# Patient Record
Sex: Male | Born: 1975 | Race: White | Hispanic: No | Marital: Married | Smoking: Never smoker
Health system: Southern US, Community
[De-identification: ages and names within clinical notes are randomized; demographics above are authoritative.]

## PROBLEM LIST (undated history)

## (undated) DIAGNOSIS — K648 Other hemorrhoids: Secondary | ICD-10-CM

## (undated) DIAGNOSIS — I10 Essential (primary) hypertension: Secondary | ICD-10-CM

## (undated) DIAGNOSIS — K649 Unspecified hemorrhoids: Secondary | ICD-10-CM

## (undated) DIAGNOSIS — M1990 Unspecified osteoarthritis, unspecified site: Secondary | ICD-10-CM

## (undated) DIAGNOSIS — E669 Obesity, unspecified: Secondary | ICD-10-CM

## (undated) HISTORY — DX: Essential (primary) hypertension: I10

## (undated) HISTORY — DX: Other hemorrhoids: K64.8

## (undated) HISTORY — PX: WISDOM TOOTH EXTRACTION: SHX21

## (undated) HISTORY — DX: Unspecified hemorrhoids: K64.9

## (undated) HISTORY — DX: Obesity, unspecified: E66.9

---

## 2008-11-05 ENCOUNTER — Emergency Department (HOSPITAL_COMMUNITY): Admission: EM | Admit: 2008-11-05 | Discharge: 2008-11-05 | Payer: Self-pay | Admitting: Emergency Medicine

## 2008-11-10 ENCOUNTER — Emergency Department (HOSPITAL_COMMUNITY): Admission: EM | Admit: 2008-11-10 | Discharge: 2008-11-10 | Payer: Self-pay | Admitting: Emergency Medicine

## 2011-10-21 ENCOUNTER — Encounter (INDEPENDENT_AMBULATORY_CARE_PROVIDER_SITE_OTHER): Payer: Self-pay | Admitting: General Surgery

## 2011-10-22 ENCOUNTER — Encounter (INDEPENDENT_AMBULATORY_CARE_PROVIDER_SITE_OTHER): Payer: Self-pay | Admitting: General Surgery

## 2011-10-22 ENCOUNTER — Ambulatory Visit (INDEPENDENT_AMBULATORY_CARE_PROVIDER_SITE_OTHER): Payer: 59 | Admitting: General Surgery

## 2011-10-22 ENCOUNTER — Other Ambulatory Visit (INDEPENDENT_AMBULATORY_CARE_PROVIDER_SITE_OTHER): Payer: Self-pay | Admitting: General Surgery

## 2011-10-22 VITALS — BP 138/102 | HR 80 | Temp 97.4°F | Resp 16 | Ht 70.0 in | Wt 242.4 lb

## 2011-10-22 DIAGNOSIS — K648 Other hemorrhoids: Secondary | ICD-10-CM

## 2011-10-22 HISTORY — DX: Other hemorrhoids: K64.8

## 2011-10-22 NOTE — Patient Instructions (Signed)
Call our office at [phone] to schedule your surgery  Perform a enema the morning of surgery   Hemorrhoidectomy Hemorrhoidectomy is surgery to remove hemorrhoids. Hemorrhoids are veins that have become swollen in the rectum. The rectum is the area from the bottom end of the intestines to the opening where bowel movements leave the body. Hemorrhoids can be uncomfortable. They can cause itching, bleeding and pain if a blood clot forms in them (thrombose). If hemorrhoids are small, surgery may not be needed. But if they cover a larger area, surgery is usually suggested.  LET YOUR CAREGIVER KNOW ABOUT:   Any allergies.   All medications you are taking, including:   Herbs, eyedrops, over-the-counter medications and creams.   Blood thinners (anticoagulants), aspirin or other drugs that could affect blood clotting.   Use of steroids (by mouth or as creams).   Previous problems with anesthetics, including local anesthetics.   Possibility of pregnancy, if this applies.   Any history of blood clots.   Any history of bleeding or other blood problems.   Previous surgery.   Smoking history.   Other health problems.  RISKS AND COMPLICATIONS All surgery carries some risk. However, hemorrhoid surgery usually goes smoothly. Possible complications could include:  Urinary retention.   Bleeding.   Infection.   A painful incision.   A reaction to the anesthesia (this is not common).  BEFORE THE PROCEDURE   Stop using aspirin and non-steroidal anti-inflammatory drugs (NSAIDs) for pain relief. This includes prescription drugs and over-the-counter drugs such as ibuprofen and naproxen. Also stop taking vitamin E. If possible, do this two weeks before your surgery.   If you take blood-thinners, ask your healthcare provider when you should stop taking them.   You will probably have blood and urine tests done several days before your surgery.   Do not eat or drink for about 8 hours before the  surgery.   Arrive at least an hour before the surgery, or whenever your surgeon recommends. This will give you time to check in and fill out any needed paperwork.   Hemorrhoidectomy is often an outpatient procedure. This means you will be able to go home the same day. Sometimes, though, people stay overnight in the hospital after the procedure. Ask your surgeon what to expect. Either way, make arrangements in advance for someone to drive you home.  PROCEDURE   The preparation:   You will change into a hospital gown.   You will be given an IV. A needle will be inserted in your arm. Medication can flow directly into your body through this needle.   You might be given an enema to clear your rectum.   Once in the operating room, you will probably lie on your side or be repositioned later to lying on your stomach.   You will be given anesthesia (medication) so you will not feel anything during the surgery. The surgery often is done with local anesthesia (the area near the hemorrhoids will be numb and you will be drowsy but awake). Sometimes, general anesthesia is used (you will be asleep during the procedure).   The procedure:   There are a few different procedures for hemorrhoids. Be sure to ask you surgeon about the procedure, the risks and benefits.   Be sure to ask about what you need to do to take care of the wound, if there is one.  AFTER THE PROCEDURE  You will stay in a recovery area until the anesthesia has worn off.  Your blood pressure and pulse will be checked every so often.   You may feel a lot of pain in the area of the rectum.   Take all pain medication prescribed by your surgeon. Ask before taking any over-the-counter pain medicines.   Sometimes sitting in a warm bath can help relieve your pain.   To make sure you have bowel movements without straining:   You will probably need to take stool softeners (usually a pill) for a few days.   You should drink 8 to 10 glasses  of water each day.   Your activity will be restricted for awhile. Ask your caregiver for a list of what you should and should not do while you recover.  Document Released: 09/27/2009 Document Revised: 08/12/2011 Document Reviewed: 09/27/2009 [facility] Patient Information 2012 [place], [place].

## 2011-10-22 NOTE — Progress Notes (Signed)
Chief Complaint  Patient presents with  . New Evaluation    eval of bleeding internal hemorrhoids     HPI Stephen Carr is a 35 y.o. male.   HPI 76 year-old Caucasian male referred by his primary care physician for bleeding internal hemorrhoids. The patient states that his hemorrhoids have been bleeding for about past 5 weeks. He denies any pain with defecation. Sometimes there is some soreness. He denies any incontinence. He reports 2 bowel movements per day. He denies straining or reading in the bathroom. He eats  a high fiber diet. Sometimes the hemorrhoid will protrude after a bowel movement but it'll spontaneously reduce. He had a normal colonoscopy 3 years ago.  Past Medical History  Diagnosis Date  . Obesity   . Hemorrhoid   . Hypertension     Past Surgical History  Procedure Date  . Wisdom tooth extraction     History reviewed. No pertinent family history.  Social History History  Substance Use Topics  . Smoking status: Never Smoker   . Smokeless tobacco: Never Used  . Alcohol Use: 2.4 oz/week    2 Glasses of wine, 2 Cans of beer per week    No Known Allergies  Current Outpatient Prescriptions  Medication Sig Dispense Refill  . Folate-B12-Intrinsic Factor 800-500-20 MCG-MCG-MG TABS Take 400 mg by mouth daily.        Marland Kitchen HORSE CHESTNUT PO Take 470 mg by mouth daily.        . hydrocortisone (ANUSOL-HC) 2.5 % rectal cream Place 1 application rectally 2 (two) times daily.        Marland Kitchen ibuprofen (ADVIL,MOTRIN) 200 MG tablet Take 200 mg by mouth every 6 (six) hours as needed.        . Iron Combinations (IRON COMPLEX PO) Take 26 mg by mouth daily.        . Multiple Vitamin (MULTIVITAMIN) tablet Take 1 tablet by mouth daily.        Marland Kitchen RHODIOLA ROSEA PO Take 500 mg by mouth daily.        . vitamin B-12 (CYANOCOBALAMIN) 50 MCG tablet Take 30 mcg by mouth daily.        Marland Kitchen VITAMIN C, CALCIUM ASCORBATE, PO Take 15 mg by mouth daily.          Review of Systems Review of Systems    Constitutional: Negative for fever, chills and unexpected weight change.  HENT: Negative for hearing loss, congestion, sore throat, trouble swallowing and voice change.   Eyes: Negative for visual disturbance.  Respiratory: Negative for cough, chest tightness and wheezing.   Cardiovascular: Negative for chest pain, palpitations and leg swelling.  Gastrointestinal: Positive for blood in stool and anal bleeding. Negative for nausea, vomiting, abdominal pain, diarrhea, constipation, abdominal distention and rectal pain.  Genitourinary: Negative for urgency, hematuria and difficulty urinating.  Musculoskeletal: Negative for arthralgias.  Skin: Negative for rash and wound.  Neurological: Negative for seizures, syncope, weakness and headaches.  Hematological: Negative for adenopathy. Does not bruise/bleed easily.  Psychiatric/Behavioral: Negative for confusion.    Blood pressure 138/102, pulse 80, temperature 97.4 F (36.3 C), temperature source Temporal, resp. rate 16, height 5\' 10"  (1.778 m), weight 242 lb 6 oz (109.941 kg).  Physical Exam Physical Exam  Vitals reviewed. Constitutional: He is oriented to person, place, and time. He appears well-developed and well-nourished. No distress.  HENT:  Head: Normocephalic and atraumatic.  Eyes: Conjunctivae are normal. No scleral icterus.  Neck: Normal range of motion. Neck supple. No  JVD present. No thyromegaly present.  Cardiovascular: Normal rate and regular rhythm.   Pulmonary/Chest: Effort normal and breath sounds normal. No respiratory distress.  Abdominal: Soft. Bowel sounds are normal. He exhibits no distension. There is no tenderness.  Genitourinary: Rectal exam shows no external hemorrhoid, no fissure and anal tone normal.       Large left posterior lateral internal hem; sm right posterior internal hem  Musculoskeletal: Normal range of motion. He exhibits no edema.  Lymphadenopathy:    He has no cervical adenopathy.  Neurological: He  is alert and oriented to person, place, and time. He exhibits normal muscle tone.  Skin: Skin is warm and dry. No rash noted. No erythema.  Psychiatric: He has a normal mood and affect. His behavior is normal. Judgment and thought content normal.    Data Reviewed Dr Glendale Chard note  Assessment    Bleeding Two Column Internal Hemorrhoids    Plan    We discussed the etiology and management of internal hemorrhoids.  The patient has very good bowel habits (drinking 6-8 glasses water/day, eating a high fiber diet, etc).  I believe he has maximized medical management.   I have recommended an exam under anesthesia with excisional hemorrhoidectomy, possible banding. The patient has decided to proceed with surgery.   I discussed the procedure in detail.  The patient was given Agricultural engineer.  We discussed the risks and benefits of surgery including, but not limited to bleeding, infection, blood clot formation, anesthesia risk, urinary retention, hemorrhoid recurrence, injury to the sphincters resulting in incontinence, and the rare possibility of anal canal narrowing. I explained that the likelihood of improvement of their symptoms is good.  We discussed the typical postoperative course.  I stressed the importance of not becoming constipated after surgery.  The patient was encouraged to limit pain medication if possible as this increases the likelihood of becoming constipated. The patient was advised to take stool softners & drink 8-10 glasses of non-carbonated, non-alcoholic beverages per day and to eat a high fiber diet.  I also encouraged soaking in a water warm bath for 15 minutes at a time several times a day and after a bowel movement.  The patient was advised to take laxatives such as milk of magnesia or Miralax if no bowel movement three days after surgery.  The patient was advised to expect some blood tinged drainage as well as some blood in their bowel movements.   He is going to call  our office to schedule surgery after talking with his wife.  Mary Sella. Andrey Campanile, MD, FACS General, Bariatric, & Minimally Invasive Surgery [facility], [place]         [facility] M 10/22/2011, 2:09 PM

## 2011-11-23 ENCOUNTER — Encounter (HOSPITAL_COMMUNITY): Payer: Self-pay | Admitting: Pharmacy Technician

## 2011-11-25 ENCOUNTER — Ambulatory Visit (HOSPITAL_COMMUNITY)
Admission: RE | Admit: 2011-11-25 | Discharge: 2011-11-25 | Disposition: A | Discharge status: Home / Self Care | Payer: 59 | Source: Ambulatory Visit | Attending: General Surgery | Admitting: General Surgery

## 2011-11-25 ENCOUNTER — Encounter (HOSPITAL_COMMUNITY): Payer: Self-pay

## 2011-11-25 ENCOUNTER — Encounter (HOSPITAL_COMMUNITY)
Admission: RE | Admit: 2011-11-25 | Discharge: 2011-11-25 | Disposition: A | Discharge status: Home / Self Care | Payer: 59 | Source: Ambulatory Visit | Attending: General Surgery | Admitting: General Surgery

## 2011-11-25 ENCOUNTER — Other Ambulatory Visit: Payer: Self-pay

## 2011-11-25 DIAGNOSIS — I10 Essential (primary) hypertension: Secondary | ICD-10-CM | POA: Insufficient documentation

## 2011-11-25 DIAGNOSIS — Z01818 Encounter for other preprocedural examination: Secondary | ICD-10-CM | POA: Insufficient documentation

## 2011-11-25 DIAGNOSIS — Z01810 Encounter for preprocedural cardiovascular examination: Secondary | ICD-10-CM | POA: Insufficient documentation

## 2011-11-25 DIAGNOSIS — Z01812 Encounter for preprocedural laboratory examination: Secondary | ICD-10-CM | POA: Insufficient documentation

## 2011-11-25 HISTORY — DX: Unspecified osteoarthritis, unspecified site: M19.90

## 2011-11-25 LAB — SURGICAL PCR SCREEN: Staphylococcus aureus: NEGATIVE

## 2011-11-25 LAB — CBC
MCHC: 34.8 g/dL (ref 30.0–36.0)
Platelets: 257 10*3/uL (ref 150–400)
RBC: 5.16 MIL/uL (ref 4.22–5.81)

## 2011-11-25 LAB — BASIC METABOLIC PANEL
BUN: 10 mg/dL (ref 6–23)
Calcium: 9.7 mg/dL (ref 8.4–10.5)
GFR calc non Af Amer: >90 mL/min (ref >90)
Potassium: 4.1 mEq/L (ref 3.5–5.1)
Sodium: 140 mEq/L (ref 135–145)

## 2011-11-25 LAB — DIFFERENTIAL
Basophils Absolute: 0 10*3/uL (ref 0.0–0.1)
Basophils Relative: 0 % (ref 0–1)
Lymphocytes Relative: 39 % (ref 12–46)
Neutro Abs: 3.2 10*3/uL (ref 1.7–7.7)
Neutrophils Relative %: 51 % (ref 43–77)

## 2011-11-25 NOTE — Pre-Procedure Instructions (Signed)
PT IS JEHOVAH'S WITNESS--SIGNED REFUSAL FOR BLOOD.  COPY OF REFUSAL FAXED TO DR. Tawana Scale OFFICE AND COPY TAKEN TO BLOOD BANK.

## 2011-11-25 NOTE — Patient Instructions (Signed)
20 Stephen Carr  11/25/2011   Your procedure is scheduled on:  Monday 11/30/11  AT 7:15 AM  Report to [facility] at 5:15 AM.  Call this number if you have problems the morning of surgery: [phone]   Remember:FLEET ENEMA AM BEFORE YOUR SURGERY   Do not eat food OR DRINK ANYTHING AFTER MIDNIGHT THE NIGHT BEFORE YOUR SURGERY.       Do not wear jewelry, make-up or nail polish.  Do not wear lotions, powders, or perfumes. You may wear deodorant.  Do not shave 48 hours prior to surgery.  Do not bring valuables to the hospital.  Contacts, dentures or bridgework may not be worn into surgery.  Leave suitcase in the car. After surgery it may be brought to your room.  For patients admitted to the hospital, checkout time is 11:00 AM the day of discharge.   Patients discharged the day of surgery will not be allowed to drive home.  Name and phone number of your driver: WIFE  Special Instructions: CHG Shower Use Special Wash: 1/2 bottle night before surgery and 1/2 bottle morning of surgery.   Please read over the following fact sheets that you were given: MRSA Information

## 2011-11-30 ENCOUNTER — Encounter (HOSPITAL_COMMUNITY): Payer: Self-pay | Admitting: Anesthesiology

## 2011-11-30 ENCOUNTER — Other Ambulatory Visit (INDEPENDENT_AMBULATORY_CARE_PROVIDER_SITE_OTHER): Payer: Self-pay | Admitting: General Surgery

## 2011-11-30 ENCOUNTER — Ambulatory Visit (HOSPITAL_COMMUNITY)
Admission: RE | Admit: 2011-11-30 | Discharge: 2011-11-30 | Disposition: A | Discharge status: Home / Self Care | Payer: 59 | Source: Ambulatory Visit | Attending: General Surgery | Admitting: General Surgery

## 2011-11-30 ENCOUNTER — Encounter (HOSPITAL_COMMUNITY)
Admission: RE | Disposition: A | Discharge status: Home / Self Care | Payer: Self-pay | Source: Ambulatory Visit | Attending: General Surgery

## 2011-11-30 ENCOUNTER — Ambulatory Visit (HOSPITAL_COMMUNITY): Payer: 59 | Admitting: Anesthesiology

## 2011-11-30 ENCOUNTER — Encounter (HOSPITAL_COMMUNITY): Payer: Self-pay | Admitting: *Deleted

## 2011-11-30 DIAGNOSIS — K648 Other hemorrhoids: Secondary | ICD-10-CM | POA: Insufficient documentation

## 2011-11-30 DIAGNOSIS — Z79899 Other long term (current) drug therapy: Secondary | ICD-10-CM | POA: Insufficient documentation

## 2011-11-30 DIAGNOSIS — I10 Essential (primary) hypertension: Secondary | ICD-10-CM | POA: Insufficient documentation

## 2011-11-30 DIAGNOSIS — E669 Obesity, unspecified: Secondary | ICD-10-CM | POA: Insufficient documentation

## 2011-11-30 HISTORY — PX: HEMORRHOID SURGERY: SHX153

## 2011-11-30 SURGERY — EXAM UNDER ANESTHESIA WITH HEMORRHOIDECTOMY
Anesthesia: General | Site: Rectum | Wound class: Contaminated

## 2011-11-30 MED ORDER — MIDAZOLAM HCL 5 MG/5ML IJ SOLN
INTRAMUSCULAR | Status: DC | PRN
  Administered 2011-11-30: 2 mg via INTRAVENOUS

## 2011-11-30 MED ORDER — LACTATED RINGERS IV SOLN: INTRAVENOUS | Status: DC

## 2011-11-30 MED ORDER — ONDANSETRON HCL 4 MG/2ML IJ SOLN
INTRAMUSCULAR | Status: DC | PRN
  Administered 2011-11-30: 4 mg via INTRAVENOUS

## 2011-11-30 MED ORDER — SODIUM CHLORIDE 0.9 % IV SOLN: 250.0000 mL | INTRAVENOUS | Status: DC | PRN

## 2011-11-30 MED ORDER — BUPIVACAINE-EPINEPHRINE 0.25% -1:200000 IJ SOLN
INTRAMUSCULAR | Status: DC | PRN
  Administered 2011-11-30: 5 mL

## 2011-11-30 MED ORDER — ACETAMINOPHEN 10 MG/ML IV SOLN
INTRAVENOUS | Status: AC
  Filled 2011-11-30: qty 100

## 2011-11-30 MED ORDER — BUPIVACAINE HCL (PF) 0.25 % IJ SOLN
INTRAMUSCULAR | Status: AC
  Filled 2011-11-30: qty 30

## 2011-11-30 MED ORDER — DIBUCAINE 1 % RE OINT
TOPICAL_OINTMENT | RECTAL | Status: DC | PRN
  Administered 2011-11-30: 1 via RECTAL

## 2011-11-30 MED ORDER — BUPIVACAINE LIPOSOME 1.3 % IJ SUSP
20.0000 mL | Freq: Once | INTRAMUSCULAR | Status: DC
  Filled 2011-11-30: qty 20

## 2011-11-30 MED ORDER — PROMETHAZINE HCL 25 MG/ML IJ SOLN: 12.5000 mg | Freq: Four times a day (QID) | INTRAMUSCULAR | Status: DC | PRN

## 2011-11-30 MED ORDER — ACETAMINOPHEN 325 MG PO TABS: 650.0000 mg | ORAL_TABLET | ORAL | Status: DC | PRN

## 2011-11-30 MED ORDER — BUPIVACAINE-EPINEPHRINE PF 0.25-1:200000 % IJ SOLN
INTRAMUSCULAR | Status: AC
  Filled 2011-11-30: qty 30

## 2011-11-30 MED ORDER — SODIUM CHLORIDE 0.9 % IJ SOLN: 3.0000 mL | Freq: Two times a day (BID) | INTRAMUSCULAR | Status: DC

## 2011-11-30 MED ORDER — ACETAMINOPHEN 650 MG RE SUPP
650.0000 mg | RECTAL | Status: DC | PRN
  Filled 2011-11-30: qty 1

## 2011-11-30 MED ORDER — CEFOXITIN SODIUM-DEXTROSE 1-4 GM-% IV SOLR (PREMIX)
INTRAVENOUS | Status: AC
  Filled 2011-11-30: qty 100

## 2011-11-30 MED ORDER — ONDANSETRON HCL 4 MG/2ML IJ SOLN: 4.0000 mg | Freq: Four times a day (QID) | INTRAMUSCULAR | Status: DC | PRN

## 2011-11-30 MED ORDER — FENTANYL CITRATE 0.05 MG/ML IJ SOLN
INTRAMUSCULAR | Status: DC | PRN
  Administered 2011-11-30: 150 ug via INTRAVENOUS
  Administered 2011-11-30: 100 ug via INTRAVENOUS

## 2011-11-30 MED ORDER — BUPIVACAINE LIPOSOME 1.3 % IJ SUSP
INTRAMUSCULAR | Status: DC | PRN
  Administered 2011-11-30: 20 mL

## 2011-11-30 MED ORDER — SODIUM CHLORIDE 0.9 % IJ SOLN: 3.0000 mL | INTRAMUSCULAR | Status: DC | PRN

## 2011-11-30 MED ORDER — NEOSTIGMINE METHYLSULFATE 1 MG/ML IJ SOLN
INTRAMUSCULAR | Status: DC | PRN
  Administered 2011-11-30: 5 mg via INTRAVENOUS

## 2011-11-30 MED ORDER — CEFOXITIN SODIUM 2 G IV SOLR
2.0000 g | INTRAVENOUS | Status: AC
  Administered 2011-11-30: 2 g via INTRAVENOUS
  Filled 2011-11-30: qty 2

## 2011-11-30 MED ORDER — ACETAMINOPHEN 10 MG/ML IV SOLN
INTRAVENOUS | Status: DC | PRN
  Administered 2011-11-30: 1000 mg via INTRAVENOUS

## 2011-11-30 MED ORDER — MORPHINE SULFATE 2 MG/ML IJ SOLN: 1.0000 mg | INTRAMUSCULAR | Status: DC | PRN

## 2011-11-30 MED ORDER — DEXAMETHASONE SODIUM PHOSPHATE 10 MG/ML IJ SOLN
INTRAMUSCULAR | Status: DC | PRN
  Administered 2011-11-30: 10 mg via INTRAVENOUS

## 2011-11-30 MED ORDER — LACTATED RINGERS IV SOLN
INTRAVENOUS | Status: DC | PRN
  Administered 2011-11-30 (×2): via INTRAVENOUS

## 2011-11-30 MED ORDER — GLYCOPYRROLATE 0.2 MG/ML IJ SOLN
INTRAMUSCULAR | Status: DC | PRN
  Administered 2011-11-30: .8 mg via INTRAVENOUS

## 2011-11-30 MED ORDER — OXYCODONE-ACETAMINOPHEN 5-325 MG PO TABS: 1.0000 | ORAL_TABLET | ORAL | Status: AC | PRN

## 2011-11-30 MED ORDER — FENTANYL CITRATE 0.05 MG/ML IJ SOLN: 25.0000 ug | INTRAMUSCULAR | Status: DC | PRN

## 2011-11-30 MED ORDER — ROCURONIUM BROMIDE 100 MG/10ML IV SOLN
INTRAVENOUS | Status: DC | PRN
  Administered 2011-11-30: 50 mg via INTRAVENOUS

## 2011-11-30 MED ORDER — OXYCODONE HCL 5 MG PO TABS: 5.0000 mg | ORAL_TABLET | ORAL | Status: DC | PRN

## 2011-11-30 MED ORDER — DIBUCAINE 1 % RE OINT
TOPICAL_OINTMENT | RECTAL | Status: AC
  Filled 2011-11-30: qty 28

## 2011-11-30 MED ORDER — LIDOCAINE HCL (CARDIAC) 20 MG/ML IV SOLN
INTRAVENOUS | Status: DC | PRN
  Administered 2011-11-30: 100 mg via INTRAVENOUS

## 2011-11-30 MED ORDER — PROMETHAZINE HCL 25 MG/ML IJ SOLN: 6.2500 mg | INTRAMUSCULAR | Status: DC | PRN

## 2011-11-30 MED ORDER — PROPOFOL 10 MG/ML IV EMUL
INTRAVENOUS | Status: DC | PRN
  Administered 2011-11-30: 200 mg via INTRAVENOUS

## 2011-11-30 SURGICAL SUPPLY — 39 items
BLADE SURG 15 STRL LF DISP TIS (BLADE) ×1 IMPLANT
BLADE SURG 15 STRL SS (BLADE) ×1
BLADE SURG SZ10 CARB STEEL (BLADE) IMPLANT
BRIEF STRETCH FOR OB PAD LRG (UNDERPADS AND DIAPERS) ×2 IMPLANT
CANISTER SUCTION 2500CC (MISCELLANEOUS) ×2 IMPLANT
CLOTH BEACON ORANGE TIMEOUT ST (SAFETY) ×2 IMPLANT
COVER SURGICAL LIGHT HANDLE (MISCELLANEOUS) ×2 IMPLANT
DECANTER SPIKE VIAL GLASS SM (MISCELLANEOUS) ×2 IMPLANT
DRAPE LAPAROTOMY T 102X78X121 (DRAPES) IMPLANT
DRAPE LG THREE QUARTER DISP (DRAPES) IMPLANT
DRAPE PED LAPAROTOMY (DRAPES) IMPLANT
DRAPE UTILITY XL STRL (DRAPES) ×2 IMPLANT
ELECT NEEDLE TIP 2.8 STRL (NEEDLE) IMPLANT
ELECT REM PT RETURN 9FT ADLT (ELECTROSURGICAL) ×2
ELECTRODE REM PT RTRN 9FT ADLT (ELECTROSURGICAL) ×1 IMPLANT
GAUZE SPONGE 4X4 12PLY STRL LF (GAUZE/BANDAGES/DRESSINGS) ×2 IMPLANT
GAUZE SPONGE 4X4 16PLY XRAY LF (GAUZE/BANDAGES/DRESSINGS) ×2 IMPLANT
GLOVE BIO SURGEON STRL SZ7.5 (GLOVE) ×2 IMPLANT
GLOVE BIOGEL PI IND STRL 7.0 (GLOVE) ×1 IMPLANT
GLOVE BIOGEL PI INDICATOR 7.0 (GLOVE) ×1
GLOVE INDICATOR 8.0 STRL GRN (GLOVE) ×2 IMPLANT
GOWN STRL NON-REIN LRG LVL3 (GOWN DISPOSABLE) ×2 IMPLANT
GOWN STRL REIN XL XLG (GOWN DISPOSABLE) ×4 IMPLANT
KIT BASIN OR (CUSTOM PROCEDURE TRAY) ×2 IMPLANT
LUBRICANT JELLY K Y 4OZ (MISCELLANEOUS) ×2 IMPLANT
NDL SAFETY ECLIPSE 18X1.5 (NEEDLE) IMPLANT
NEEDLE HYPO 18GX1.5 SHARP (NEEDLE)
NEEDLE HYPO 25X1 1.5 SAFETY (NEEDLE) ×4 IMPLANT
NS IRRIG 1000ML POUR BTL (IV SOLUTION) ×2 IMPLANT
PACK BASIC VI WITH GOWN DISP (CUSTOM PROCEDURE TRAY) ×2 IMPLANT
PENCIL BUTTON HOLSTER BLD 10FT (ELECTRODE) ×2 IMPLANT
SPONGE GAUZE 4X4 12PLY (GAUZE/BANDAGES/DRESSINGS) IMPLANT
SPONGE SURGIFOAM ABS GEL 100 (HEMOSTASIS) IMPLANT
SUT CHROMIC 2 0 SH (SUTURE) IMPLANT
SUT CHROMIC 3 0 SH 27 (SUTURE) IMPLANT
SYR CONTROL 10ML LL (SYRINGE) ×4 IMPLANT
TIPS TEFLON (MISCELLANEOUS) ×2 IMPLANT
TOWEL OR 17X26 10 PK STRL BLUE (TOWEL DISPOSABLE) ×2 IMPLANT
YANKAUER SUCT BULB TIP 10FT TU (MISCELLANEOUS) ×4 IMPLANT

## 2011-11-30 NOTE — Op Note (Signed)
NAMEMarland Kitchen  Stephen Carr, Stephen Carr NO.:  [id number]  MEDICAL RECORD NO.:  [id number]  LOCATION:  [facility]                         FACILITY:  [facility]  PHYSICIAN:  Mary Sella. Andrey Campanile, MD     DATE OF BIRTH:  10/08/1976  DATE OF PROCEDURE: DATE OF DISCHARGE:                              OPERATIVE REPORT   PREOPERATIVE DIAGNOSIS:  Bleeding, internal hemorrhoids.  POSTOPERATIVE DIAGNOSIS:  Bleeding internal hemorrhoids.  PROCEDURE:  Exam under anesthesia,  open left posterolateral excisional hemorrhoidectomy.  SURGEON:  Gaynelle Adu, MD, FACS.  ANESTHESIA:  General plus local consisting of 4% Marcaine with epinephrine.  In addition to 20 cc of Exparel.  ESTIMATED BLOOD LOSS:  Minimal.  COMPLICATIONS:  None immediately apparent.  FINDINGS:  The patient had a grade 3 large left posterolateral internal hemorrhoids.  He also had a very small grade 1 right posterolateral hemorrhoid that I elected to leave alone.  SPECIMEN:  Internal hemorrhoids.  INDICATIONS FOR PROCEDURE:  The patient is a very pleasant 35 year old gentleman, who has had problems with bleeding and internal hemorrhoids. This started about 8 weeks ago.  He has normal bowel movements every day.  He does not strain.  He does not read in the bathroom.  He eats a high-fiber diet.  Despite these normal bowel habits he states the hemorrhoid will protrude after bowel movements, but will spontaneously reduce.  We discussed the risks and benefits of hemorrhoidectomy including but not limited to bleeding, infection, injury to surrounding structures, urinary retention, blood clot formation, hemorrhoid recurrence, anesthesia risk, and rare possibility of anal canal narrowing.  He elects to proceed with surgery.  DESCRIPTION OF PROCEDURE:  After informed consent, the patient is brought to the operating room.  General endotracheal anesthesia was established.  He was then placed into the prone jack-knife position with the  appropriate padding.  Sequential compression devices had been placed.  His buttocks were taped apart and his perineum was prepped with Betadine.  He received 2 g of cefoxitin prior to skin incision.  A surgical time-out was performed.  On gross inspection, there was no evidence of fissure or fistula.  He had a mixed column left posterolateral hemorrhoid that was grade 3.  On further inspection with the anoscope, he had a very small right posterolateral grade 1 internal hemorrhoids.  Because of the large nature of the left posterolateral, I felt it would be best served by an excisional hemorrhoidectomy.  Some local with epinephrine was infiltrated around the anoderm.  I then made a V-shaped incision with a #15 blade.  Then using hemostat, I lifted up the mucosa and submucosa from the underlying sphincter.  Then using a Harmonic Scalpel, I elliptically excised the mix column left posterolateral hemorrhoid.  I took great care not to injure the underlying muscular fibers.  The hemorrhoid was excised in its entirety. It was a fairly large hemorrhoid across the posterior midline to the right side.  Because of how large, it was elected not to reapproximate the mucosa with a suture.  Because of the open excisional hemorrhoidectomy's proximity to the small grade 1 hemorrhoid, on the right, I elected to leave that small hemorrhoids alone.  I  then infiltrated 20 cc of Exparel in a regional fashion.  I placed a small piece of Gelfoam in the patient's rectum and then applied Bupivacaine ointment to around his perineum 4 x 4s fluffs and mesh underwear were then applied.  He was then placed into the supine position, extubated and taken to recovery room in stable condition.  There are no immediate complications.  The patient tolerated the procedure well.     Mary Sella. Andrey Campanile, MD     EMW/MEDQ  D:  11/30/2011  T:  11/30/2011  Job:  [id number]  cc:   Darrow Bussing, MD Fax: [phone]

## 2011-11-30 NOTE — Progress Notes (Signed)
Pt did fleets enema this am with good results

## 2011-11-30 NOTE — Anesthesia Preprocedure Evaluation (Addendum)
Anesthesia Evaluation  Patient identified by MRN, date of birth, ID band Patient awake    Reviewed: Allergy & Precautions, H&P , NPO status , Patient's Chart, lab work & pertinent test results  Airway Mallampati: II TM Distance: >3 FB Neck ROM: full    Dental No notable dental hx. (+) Teeth Intact and Dental Advisory Given   Pulmonary neg pulmonary ROS,  clear to auscultation  Pulmonary exam normal       Cardiovascular Exercise Tolerance: Good hypertension, + Peripheral Vascular Disease and neg cardio ROS regular Normal    Neuro/Psych Negative Neurological ROS  Negative Psych ROS   GI/Hepatic negative GI ROS, Neg liver ROS,   Endo/Other  Negative Endocrine ROS  Renal/GU negative Renal ROS  Genitourinary negative   Musculoskeletal   Abdominal   Peds  Hematology negative hematology ROS (+)   Anesthesia Other Findings   Reproductive/Obstetrics negative OB ROS                         Anesthesia Physical Anesthesia Plan  ASA: II  Anesthesia Plan: General   Post-op Pain Management:    Induction: Intravenous  Airway Management Planned: LMA  Additional Equipment:   Intra-op Plan:   Post-operative Plan:   Informed Consent: I have reviewed the patients History and Physical, chart, labs and discussed the procedure including the risks, benefits and alternatives for the proposed anesthesia with the patient or authorized representative who has indicated his/her understanding and acceptance.   Dental Advisory Given  Plan Discussed with: CRNA and Surgeon  Anesthesia Plan Comments:         Anesthesia Quick Evaluation

## 2011-11-30 NOTE — H&P (Signed)
Chief Complaint   Patient presents with   .  New Evaluation     eval of bleeding internal hemorrhoids    HPI  Stephen Carr is a 35 y.o. male.   HPI  31 year-old Caucasian male referred by his primary care physician for bleeding internal hemorrhoids. The patient states that his hemorrhoids have been bleeding for about past 5 weeks. He denies any pain with defecation. Sometimes there is some soreness. He denies any incontinence. He reports 2 bowel movements per day. He denies straining or reading in the bathroom. He eats a high fiber diet. Sometimes the hemorrhoid will protrude after a bowel movement but it'll spontaneously reduce. He had a normal colonoscopy 3 years ago.   Past Medical History   Diagnosis  Date   .  Obesity    .  Hemorrhoid    .  Hypertension     Past Surgical History   Procedure  Date   .  Wisdom tooth extraction     History reviewed. No pertinent family history.   Social History  History   Substance Use Topics   .  Smoking status:  Never Smoker   .  Smokeless tobacco:  Never Used   .  Alcohol Use:  2.4 oz/week     2 Glasses of wine, 2 Cans of beer per week    No Known Allergies   Current Outpatient Prescriptions   Medication  Sig  Dispense  Refill   .  Folate-B12-Intrinsic Factor 800-500-20 MCG-MCG-MG TABS  Take 400 mg by mouth daily.     Marland Kitchen  HORSE CHESTNUT PO  Take 470 mg by mouth daily.     .  hydrocortisone (ANUSOL-HC) 2.5 % rectal cream  Place 1 application rectally 2 (two) times daily.     Marland Kitchen  ibuprofen (ADVIL,MOTRIN) 200 MG tablet  Take 200 mg by mouth every 6 (six) hours as needed.     .  Iron Combinations (IRON COMPLEX PO)  Take 26 mg by mouth daily.     .  Multiple Vitamin (MULTIVITAMIN) tablet  Take 1 tablet by mouth daily.     Marland Kitchen  RHODIOLA ROSEA PO  Take 500 mg by mouth daily.     .  vitamin B-12 (CYANOCOBALAMIN) 50 MCG tablet  Take 30 mcg by mouth daily.     Marland Kitchen  VITAMIN C, CALCIUM ASCORBATE, PO  Take 15 mg by mouth daily.      Review of Systems   Review of Systems  Constitutional: Negative for fever, chills and unexpected weight change.  HENT: Negative for hearing loss, congestion, sore throat, trouble swallowing and voice change.  Eyes: Negative for visual disturbance.  Respiratory: Negative for cough, chest tightness and wheezing.  Cardiovascular: Negative for chest pain, palpitations and leg swelling.  Gastrointestinal: Positive for blood in stool and anal bleeding. Negative for nausea, vomiting, abdominal pain, diarrhea, constipation, abdominal distention and rectal pain.  Genitourinary: Negative for urgency, hematuria and difficulty urinating.  Musculoskeletal: Negative for arthralgias.  Skin: Negative for rash and wound.  Neurological: Negative for seizures, syncope, weakness and headaches.  Hematological: Negative for adenopathy. Does not bruise/bleed easily.  Psychiatric/Behavioral: Negative for confusion.   BP 132/90  Pulse 92  Temp(Src) 97.6 F (36.4 C) (Oral)  Resp 18  SpO2 98%   Physical Exam  Physical Exam  Vitals reviewed.  Constitutional: He is oriented to person, place, and time. He appears well-developed and well-nourished. No distress.  HENT:  Head: Normocephalic and atraumatic.  Eyes:  Conjunctivae are normal. No scleral icterus.  Neck: Normal range of motion. Neck supple. No JVD present. No thyromegaly present.  Cardiovascular: Normal rate and regular rhythm.  Pulmonary/Chest: Effort normal and breath sounds normal. No respiratory distress.  Abdominal: Soft. Bowel sounds are normal. He exhibits no distension. There is no tenderness.  Genitourinary: Rectal exam shows no external hemorrhoid, no fissure and anal tone normal.  Large left posterior lateral internal hem; sm right posterior internal hem  Musculoskeletal: Normal range of motion. He exhibits no edema.  Lymphadenopathy:  He has no cervical adenopathy.  Neurological: He is alert and oriented to person, place, and time. He exhibits normal muscle  tone.  Skin: Skin is warm and dry. No rash noted. No erythema.  Psychiatric: He has a normal mood and affect. His behavior is normal. Judgment and thought content normal.   Data Reviewed  Dr Glendale Chard note   Assessment  Bleeding Two Column Internal Hemorrhoids   Plan  We discussed the etiology and management of internal hemorrhoids. The patient has very good bowel habits (drinking 6-8 glasses water/day, eating a high fiber diet, etc). I believe he has maximized medical management.  I have recommended an exam under anesthesia with excisional hemorrhoidectomy, possible banding. The patient has decided to proceed with surgery.  I discussed the procedure in detail. The patient was given Agricultural engineer. We discussed the risks and benefits of surgery including, but not limited to bleeding, infection, blood clot formation, anesthesia risk, urinary retention, hemorrhoid recurrence, injury to the sphincters resulting in incontinence, and the rare possibility of anal canal narrowing. I explained that the likelihood of improvement of their symptoms is good.  We discussed the typical postoperative course. I stressed the importance of not becoming constipated after surgery. The patient was encouraged to limit pain medication if possible as this increases the likelihood of becoming constipated. The patient was advised to take stool softners & drink 8-10 glasses of non-carbonated, non-alcoholic beverages per day and to eat a high fiber diet. I also encouraged soaking in a water warm bath for 15 minutes at a time several times a day and after a bowel movement. The patient was advised to take laxatives such as milk of magnesia or Miralax if no bowel movement three days after surgery. The patient was advised to expect some blood tinged drainage as well as some blood in their bowel movements.  He is going to call our office to schedule surgery after talking with his wife.   Mary Sella. Andrey Campanile, MD, FACS  General,  Bariatric, & Minimally Invasive Surgery  [facility], [place]

## 2011-11-30 NOTE — Anesthesia Postprocedure Evaluation (Signed)
  Anesthesia Post-op Note  Patient: Stephen Carr  Procedure(s) Performed:  EXAM UNDER ANESTHESIA WITH HEMORRHOIDECTOMY - exam under anesthesia , hemorrhoidectomy and banding  Patient Location: PACU  Anesthesia Type: General  Level of Consciousness: awake and alert   Airway and Oxygen Therapy: Patient Spontanous Breathing  Post-op Pain: mild  Post-op Assessment: Post-op Vital signs reviewed, Patient's Cardiovascular Status Stable, Respiratory Function Stable, Patent Airway and No signs of Nausea or vomiting  Post-op Vital Signs: stable  Complications: No apparent anesthesia complications

## 2011-11-30 NOTE — Brief Op Note (Signed)
11/30/2011  8:57 AM  PATIENT:  Stephen Carr  35 y.o. male  PRE-OPERATIVE DIAGNOSIS:  bleeding internal hemorrhoids  POST-OPERATIVE DIAGNOSIS:  bleeding internal hemorrhoids  PROCEDURE:  Procedure(s): EXAM UNDER ANESTHESIA WITH OPEN LEFT POSTEROLATERAL EXCISIONAL HEMORRHOIDECTOMY  SURGEON:  Surgeon(s): Atilano Ina, MD  PHYSICIAN ASSISTANT: NONE  ASSISTANTS: none   ANESTHESIA:   general  EBL:  Total I/O In: 1000 [I.V.:1000] Out: 25 [Blood:25]  BLOOD ADMINISTERED:none  DRAINS: none   LOCAL MEDICATIONS USED:  OTHER 5CC 0.25% MARCAINE WITH EPI; 20CC EXPAREL  SPECIMEN:  Source of Specimen:  INTERNAL HEMORRHOID  DISPOSITION OF SPECIMEN:  PATHOLOGY  COUNTS:  YES  TOURNIQUET:  * No tourniquets in log *  DICTATION: .Other Dictation: Dictation Number [id number]  PLAN OF CARE: Discharge to home after PACU  PATIENT DISPOSITION:  PACU - hemodynamically stable.

## 2011-11-30 NOTE — Interval H&P Note (Signed)
History and Physical Interval Note:  11/30/2011 7:21 AM  Stephen Carr  has presented today for surgery, with the diagnosis of bleeding internal hemorrhoids  The various methods of treatment have been discussed with the patient and family. After consideration of risks, benefits and other options for treatment, the patient has consented to  Procedure(s): EXAM UNDER ANESTHESIA WITH HEMORRHOIDECTOMY, possible banding as a surgical intervention .  The patients' history has been reviewed, patient examined, no change in status, stable for surgery.  I have reviewed the patients' chart and labs.  Questions were answered to the patient's satisfaction.    Mary Sella. Andrey Campanile, MD, FACS General, Bariatric, & Minimally Invasive Surgery [facility], [place]  [facility]

## 2011-11-30 NOTE — Preoperative (Signed)
Beta Blockers   Reason not to administer Beta Blockers:Not Applicable 

## 2011-11-30 NOTE — Transfer of Care (Signed)
Immediate Anesthesia Transfer of Care Note  Patient: Stephen Carr  Procedure(s) Performed:  EXAM UNDER ANESTHESIA WITH HEMORRHOIDECTOMY - exam under anesthesia , hemorrhoidectomy and banding  Patient Location: PACU  Anesthesia Type: General  Level of Consciousness: awake, alert  and oriented  Airway & Oxygen Therapy: Patient Spontanous Breathing and Patient connected to face mask oxygen  Post-op Assessment: Report given to PACU RN and Post -op Vital signs reviewed and stable  Post vital signs: Reviewed and stable  Complications: No apparent anesthesia complications

## 2011-11-30 NOTE — Anesthesia Procedure Notes (Signed)
Procedure Name: Intubation Date/Time: 11/30/2011 7:46 AM Performed by: Junious Silk HATCHEL Pre-anesthesia Checklist: Patient identified, Emergency Drugs available, Suction available and Patient being monitored Patient Re-evaluated:Patient Re-evaluated prior to inductionOxygen Delivery Method: Circle System Utilized Preoxygenation: Pre-oxygenation with 100% oxygen Intubation Type: IV induction Ventilation: Mask ventilation without difficulty Laryngoscope Size: Miller and 2 Grade View: Grade II Tube size: 7.5 mm Number of attempts: 2 Airway Equipment and Method: video-laryngoscopy and stylet Placement Confirmation: ETT inserted through vocal cords under direct vision,  positive ETCO2 and breath sounds checked- equal and bilateral Secured at: 24 cm Tube secured with: Tape Dental Injury: Teeth and Oropharynx as per pre-operative assessment  Difficulty Due To: Difficulty was unanticipated and Difficult Airway- due to anterior larynx Future Recommendations: Recommend- induction with short-acting agent, and alternative techniques readily available

## 2011-12-01 ENCOUNTER — Telehealth (INDEPENDENT_AMBULATORY_CARE_PROVIDER_SITE_OTHER): Payer: Self-pay | Admitting: General Surgery

## 2011-12-01 NOTE — Telephone Encounter (Signed)
Patient called status post hemorrhoidectomy on 11/30/11. Has follow up appt on 12/22/10. 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 He is calling to see how long he will be out of work. I told him at least two weeks. Please advise.

## 2011-12-02 NOTE — Telephone Encounter (Signed)
Left message letting patient know he should be okay to return to work 2 weeks after surgery and does not need to follow up with Dr Andrey Campanile prior. Advised him to call back if he needs a note or has any other questions.

## 2011-12-03 ENCOUNTER — Encounter (INDEPENDENT_AMBULATORY_CARE_PROVIDER_SITE_OTHER): Payer: Self-pay | Admitting: General Surgery

## 2011-12-03 ENCOUNTER — Ambulatory Visit (INDEPENDENT_AMBULATORY_CARE_PROVIDER_SITE_OTHER): Payer: 59 | Admitting: General Surgery

## 2011-12-03 VITALS — BP 138/98 | HR 88 | Temp 96.8°F | Resp 18 | Ht 70.0 in | Wt 239.4 lb

## 2011-12-03 DIAGNOSIS — K645 Perianal venous thrombosis: Secondary | ICD-10-CM

## 2011-12-03 DIAGNOSIS — Z09 Encounter for follow-up examination after completed treatment for conditions other than malignant neoplasm: Secondary | ICD-10-CM

## 2011-12-03 NOTE — Progress Notes (Signed)
  Chief complaint: A large lump popped up and is causing lots of pain  Procedure: Exam under anesthesia, open left posterolateral excisional hemorrhoidectomy 11/30/11  History of Present Ilness: 35 year old male underwent the above-mentioned procedure several days ago. He states the first 2 days were great. He had minimal discomfort or pain. He only was taking Advil. However when the local medication wore off he started to develop discomfort and pressure. He started taking the Percocets. He has had 2 bowel movements since surgery. He denies any fevers, chills, nausea, vomiting, dysuria, or purulent drainage. On Wednesday when he went to have a bowel movement he noticed a large lump around his anus. It is very tender and sore. He has had very minimal bleeding.  Physical Exam: BP 138/98  Pulse 88  Temp(Src) 96.8 F (36 C) (Temporal)  Resp 18  Ht 5\' 10"  (1.778 m)  Wt 239 lb 6.4 oz (108.591 kg)  BMI 34.35 kg/m2  Well-developed well-nourished obese Caucasian male in no apparent distress Pulmonary-lungs are clear Abdomen-soft, obese, nontender, nondistended Rectal-no cellulitis or fluctuance. In the left mid position there is a thrombosed external hemorrhoid. Digital rectal exam was deferred   Assessment and Plan: Status post exam under anesthesia, open left posterior lateral excisional hemorrhoidectomy now with a thrombosed left external hemorrhoid  Because the thrombosed external hemorrhoid developed yesterday I recommended excision versus incision and drainage in the office today. We discussed the etiology of thrombosed external hemorrhoids. We discussed the aftercare.  After obtaining a verbal consent, the area was prepped with Betadine. 5 cc of 2% Xylocaine with epinephrine was injected at the base of the thrombosed hemorrhoid in the left position. I then made a half a centimeter incision directly over the thrombosed hemorrhoid. I could not evacuate the clot. Therefore I recommended  switching to an excision. Additional local was infiltrated. An elliptical incision was made excising the hemorrhoid. The patient tolerated the procedure. There is minimal bleeding. Pressure was held. A dry gauze was applied.  He is to keep his scheduled followup appointment with me. I encouraged him to continue to drink plenty of liquids, take stool softeners, and MiraLax as needed. He was also encouraged to take sitz baths. He was instructed on what to call for.  Mary Sella. Andrey Campanile, MD, FACS General, Bariatric, & Minimally Invasive Surgery [facility], [place]

## 2011-12-03 NOTE — Patient Instructions (Signed)
See your postop instructions

## 2011-12-10 ENCOUNTER — Telehealth (INDEPENDENT_AMBULATORY_CARE_PROVIDER_SITE_OTHER): Payer: Self-pay

## 2011-12-10 NOTE — Telephone Encounter (Signed)
Pt called requesting rtw note with return date of 12-14-11 and restricting lifting until at least 12-18-11. I filled out rtw form and advised pt to call next week to review status with Dr Andrey Campanile and that Dr Andrey Campanile may want pt to stay on restrictions longer. Pt states he will call on 1-3 and be sure still ok to stop restrictions on 12-16-10.

## 2011-12-11 NOTE — H&P (Signed)
Chief complaint: Bleeding hemorrhoids  History of present illness: The patient is a 35 year old Caucasian male referred by his primary care physician for her bleeding internal hemorrhoids. The patient states that his hemorrhoids have been bleeding for the past 5 weeks. He denies any pain with defecation. Sometimes there is some soreness after having a bowel movement. He denies any fecal or urinary incontinence. He reports daily bowel movements. He denies any straining or reading in the bathroom. He eats a high fiber diet. Sometimes the hemorrhoid will protrude out after a bowel movement but it will spontaneously reduce. He had a normal colonoscopy 3 years ago.  Past Medical History  Diagnosis Date  . Obesity   . Hemorrhoid     BLEEDING AT TIMES   . Hypertension     B/P SOMETIMES ELEVATED--NEVER ON B/P MEDS  . Arthritis     KNEES AND SHOULDERS HURT WITH WEATHER CHANGES   Past Surgical History  Procedure Date  . Wisdom tooth extraction     11/30/11   Family History  Problem Relation Age of Onset  . Cancer Paternal Aunt     breast, bone, pancreatic  . Heart disease Maternal Grandfather   . Cancer Paternal Grandmother     liver, and panceratic  . Heart disease Paternal Grandfather    History  Substance Use Topics  . Smoking status: Never Smoker   . Smokeless tobacco: Never Used  . Alcohol Use: 2.4 oz/week    2 Glasses of wine, 2 Cans of beer per week     COUPLE OF DRINKS ON WEEKENDS   No Known Allergies  No current facility-administered medications for this encounter.   Current Outpatient Prescriptions  Medication Sig Dispense Refill  . Folate-B12-Intrinsic Factor 800-500-20 MCG-MCG-MG TABS Take 400 mg by mouth daily.       Marland Kitchen HORSE CHESTNUT PO Take 470 mg by mouth daily.       Marland Kitchen ibuprofen (ADVIL,MOTRIN) 200 MG tablet Take 400-800 mg by mouth every 6 (six) hours as needed. For pain      . RHODIOLA ROSEA PO Take 500 mg by mouth daily.       . vitamin B-12 (CYANOCOBALAMIN) 50  MCG tablet Take 30 mcg by mouth daily.       Marland Kitchen VITAMIN C, CALCIUM ASCORBATE, PO Take 15 mg by mouth daily.       . Multiple Vitamin (MULTIVITAMIN) tablet Take 1 tablet by mouth daily.        Review of systems: A comprehensive 12 point review of systems was performed. All systems are negative except what is mentioned in the history of present illness.  Physical exam: Vitals reviewed. Please see electronic medical record for today's vitals Constitutional: Oriented x3. Well-developed and well-nourished. No apparent distress HEENT: Normocephalic, atraumatic, pupils equal, no scleral icterus, neck supple, trachea midline, no lymphadenopathy Pulmonary: Lungs are clear to auscultation, symmetric chest rise, no accessory use of muscles Cardiac: Regular rate and rhythm, pulses intact Abdomen: Obese soft, nontender, nondistended Rectal: Deferred Neuro: Nonfocal, gross sensation intact Skin: No jaundice, edema, rash  Data reviewed: I reviewed my office note  Assessment and plan: Bleeding 2 column internal hemorrhoids  I discussed the procedure in detail.  The patient was given Agricultural engineer.  We discussed the risks and benefits of surgery including, but not limited to bleeding, infection, blood clot formation, anesthesia risk, urinary retention, hemorrhoid recurrence, injury to the sphincters resulting in incontinence, and the rare possibility of anal canal narrowing. I explained that the likelihood  of improvement of their symptoms is good.  We discussed the typical postoperative course.  I stressed the importance of not becoming constipated after surgery.  The patient was encouraged to limit pain medication if possible as this increases the likelihood of becoming constipated. The patient was advised to take stool softners & drink 8-10 glasses of non-carbonated, non-alcoholic beverages per day and to eat a high fiber diet.  I also encouraged soaking in a water warm bath for 15 minutes at a time  several times a day and after a bowel movement.  The patient was advised to take laxatives such as milk of magnesia or Miralax if no bowel movement three days after surgery.  The patient was advised to expect some blood tinged drainage as well as some blood in their bowel movements.   Mary Sella. Andrey Campanile, MD, FACS General, Bariatric, & Minimally Invasive Surgery [facility], [place]

## 2011-12-23 ENCOUNTER — Encounter (INDEPENDENT_AMBULATORY_CARE_PROVIDER_SITE_OTHER): Payer: Self-pay | Admitting: General Surgery

## 2011-12-23 ENCOUNTER — Ambulatory Visit (INDEPENDENT_AMBULATORY_CARE_PROVIDER_SITE_OTHER): Payer: 59 | Admitting: General Surgery

## 2011-12-23 VITALS — BP 132/80 | HR 68 | Temp 97.6°F | Resp 18 | Ht 69.0 in | Wt 239.8 lb

## 2011-12-23 DIAGNOSIS — Z09 Encounter for follow-up examination after completed treatment for conditions other than malignant neoplasm: Secondary | ICD-10-CM

## 2011-12-23 NOTE — Progress Notes (Signed)
Chief complaint: Postop  Procedure: Status post exam under anesthesia, an open left excisional hemorrhoidectomy November 30, 2011  History of Present Ilness: 36 year old Caucasian male comes in for a postoperative appointment. I actually saw him 3 days after surgery for a thrombosed left external hemorrhoid.  We ended up excising that thrombosed hemorrhoid in the office on December 20. Since that time he states that he has done very well. He denies any incontinence. He denies any pain with defecation. He denies any bleeding. He denies any drainage. He denies any trouble urinating. He states that he is having a bowel movement twice a day. He has not taken any pain medication in over 2 weeks. He says he has some occasional itching  Physical Exam: BP 132/80  Pulse 68  Temp(Src) 97.6 F (36.4 C) (Temporal)  Resp 18  Ht 5\' 9"  (1.753 m)  Wt 239 lb 12.8 oz (108.773 kg)  BMI 35.41 kg/m2  Well-developed well-nourished obese Caucasian male in no apparent distress Pulmonary-lungs are clear Cardiac-regular rate and rhythm Rectal-digital rectal exam deferred, visual inspection reveals no thrombosed external hemorrhoid or prolapsed internal hemorrhoid. The incision in the left posterior lateral position is almost healed. There is no cellulitis, fluctuance, or induration   Assessment and Plan: Status post exam under anesthesia, an open lateral excisional hemorrhoidectomy followed by excision of the thrombosed external hemorrhoid  Overall he is doing very well. I encouraged him to keep up with stool softeners, a fiber diet, and drinking plenty of water. I explained to him that the itching should improve over time.  Followup 2 months  Mary Sella. Andrey Campanile, MD, FACS General, Bariatric, & Minimally Invasive Surgery [facility], [place]

## 2012-02-11 ENCOUNTER — Ambulatory Visit (INDEPENDENT_AMBULATORY_CARE_PROVIDER_SITE_OTHER): Payer: 59 | Admitting: General Surgery

## 2012-02-11 ENCOUNTER — Encounter (INDEPENDENT_AMBULATORY_CARE_PROVIDER_SITE_OTHER): Payer: Self-pay | Admitting: General Surgery

## 2012-02-11 VITALS — BP 156/104 | HR 100 | Temp 96.9°F | Resp 18 | Ht 69.5 in | Wt 249.8 lb

## 2012-02-11 DIAGNOSIS — Z09 Encounter for follow-up examination after completed treatment for conditions other than malignant neoplasm: Secondary | ICD-10-CM

## 2012-02-11 NOTE — Progress Notes (Signed)
Subjective:     Patient ID: Stephen Carr, male   DOB: 17-Mar-1976, 36 y.o.   MRN: [id number]  HPI 36 year old Caucasian male comes in for his second postoperative appointment. I last saw him on January 9. He had undergone an open left external hemorrhoidectomy on December 20. He then presented to the office a few days after surgery with a thrombosed external hemorrhoid that we excised in the office. Since he was last seen, he states that he has been doing very well. He reports normal soft daily bowel movements. He denies any pain with defecation. He denies incontinence. He denies any perianal itching or burning. He reports that his eating a high-fiber diet and drinking plenty of water. He denies anything protruding from his rectum when having a bowel movement. He states that his had a trace of blood on the toilet tissue twice. He states that he is under a lot of stress right now-he and his wife are in the process of finding a house  Review of Systems See above    Objective:   Physical Exam Well-developed, well-nourished obese Caucasian male in no apparent distress Rectal-no external hemorrhoids, no anal fissure, no prolapsed internal hemorrhoids. Well-healed left lateral incision. Digital rectal exam reveals good tone and no palpable masses    Assessment:     Status post exam under anesthesia, open left excisional hemorrhoidectomy-doing well.    Plan:     He appears to be completely healed from surgery. I encouraged him to keep up with good bowel habits such as drinking plenty of water, eating a high-fiber diet, and avoid reading on the commode.  Followup p.r.n.  Mary Sella. Andrey Campanile, MD, FACS General, Bariatric, & Minimally Invasive Surgery [facility], [place]

## 2012-02-11 NOTE — Patient Instructions (Signed)
Keep up with the good bowel habits like we discussed

## 2013-09-12 ENCOUNTER — Encounter (HOSPITAL_COMMUNITY): Payer: Self-pay | Admitting: Emergency Medicine

## 2013-09-12 ENCOUNTER — Emergency Department (HOSPITAL_COMMUNITY): Payer: Worker's Compensation

## 2013-09-12 ENCOUNTER — Emergency Department (HOSPITAL_COMMUNITY)
Admission: EM | Admit: 2013-09-12 | Discharge: 2013-09-12 | Disposition: A | Discharge status: Home / Self Care | Payer: Worker's Compensation | Attending: Emergency Medicine | Admitting: Emergency Medicine

## 2013-09-12 DIAGNOSIS — Y9389 Activity, other specified: Secondary | ICD-10-CM | POA: Insufficient documentation

## 2013-09-12 DIAGNOSIS — M129 Arthropathy, unspecified: Secondary | ICD-10-CM | POA: Insufficient documentation

## 2013-09-12 DIAGNOSIS — I10 Essential (primary) hypertension: Secondary | ICD-10-CM | POA: Insufficient documentation

## 2013-09-12 DIAGNOSIS — W298XXA Contact with other powered powered hand tools and household machinery, initial encounter: Secondary | ICD-10-CM | POA: Insufficient documentation

## 2013-09-12 DIAGNOSIS — Z23 Encounter for immunization: Secondary | ICD-10-CM | POA: Insufficient documentation

## 2013-09-12 DIAGNOSIS — Z8719 Personal history of other diseases of the digestive system: Secondary | ICD-10-CM | POA: Insufficient documentation

## 2013-09-12 DIAGNOSIS — S61209A Unspecified open wound of unspecified finger without damage to nail, initial encounter: Secondary | ICD-10-CM | POA: Insufficient documentation

## 2013-09-12 DIAGNOSIS — S61239A Puncture wound without foreign body of unspecified finger without damage to nail, initial encounter: Secondary | ICD-10-CM

## 2013-09-12 DIAGNOSIS — E669 Obesity, unspecified: Secondary | ICD-10-CM | POA: Insufficient documentation

## 2013-09-12 DIAGNOSIS — Z79899 Other long term (current) drug therapy: Secondary | ICD-10-CM | POA: Insufficient documentation

## 2013-09-12 DIAGNOSIS — Y929 Unspecified place or not applicable: Secondary | ICD-10-CM | POA: Insufficient documentation

## 2013-09-12 MED ORDER — TETANUS-DIPHTH-ACELL PERTUSSIS 5-2.5-18.5 LF-MCG/0.5 IM SUSP
0.5000 mL | Freq: Once | INTRAMUSCULAR | Status: AC
  Administered 2013-09-12: 0.5 mL via INTRAMUSCULAR

## 2013-09-12 MED ORDER — BACITRACIN ZINC 500 UNIT/GM EX OINT
1.0000 "application " | TOPICAL_OINTMENT | Freq: Two times a day (BID) | CUTANEOUS | Status: DC
  Administered 2013-09-12: 1 via TOPICAL

## 2013-09-12 MED ORDER — TETANUS-DIPHTH-ACELL PERTUSSIS 5-2.5-18.5 LF-MCG/0.5 IM SUSP
INTRAMUSCULAR | Status: AC
  Filled 2013-09-12: qty 0.5

## 2013-09-12 NOTE — ED Provider Notes (Signed)
CSN: [id number]     Arrival date & time 09/12/13  1709 History  This chart was scribed for non-physician practitioner, Junious Silk, PA-C working with Shon Baton, MD by Greggory Stallion, ED scribe. This patient was seen in room WTR5/WTR5 and the patient's care was started at 6:36 PM.   Chief Complaint  Patient presents with  . Puncture Wound    puncture wound to base of r/ring (4th) finger   The history is provided by the patient. No language interpreter was used.    HPI Comments: Stephen Carr is a 37 y.o. male who presents to the Emergency Department complaining of a puncture wound to the base of his right fourth finger that occurred about 2.5 hours ago. Pt states he accidentally stabbed his hand with a drill. He states he is having sudden onset mild hand pain where the wound is. Pt states he washed it out very well after it happened with soap and water. Pt states his last tetanus shot was in 2008 or 2009. He denies numbness or any other associated symptoms.  Patient is not a diabetic.   Past Medical History  Diagnosis Date  . Obesity   . Hemorrhoid     BLEEDING AT TIMES   . Hypertension     B/P SOMETIMES ELEVATED--NEVER ON B/P MEDS  . Arthritis     KNEES AND SHOULDERS HURT WITH WEATHER CHANGES  . Bleeding internal hemorrhoids 10/22/2011   Past Surgical History  Procedure Laterality Date  . Wisdom tooth extraction    . Hemorrhoid surgery  11/30/11   Family History  Problem Relation Age of Onset  . Cancer Paternal Aunt     breast, bone, pancreatic  . Heart disease Maternal Grandfather   . Cancer Paternal Grandmother     liver, and panceratic  . Heart disease Paternal Grandfather    History  Substance Use Topics  . Smoking status: Never Smoker   . Smokeless tobacco: Never Used  . Alcohol Use: 2.4 oz/week    2 Glasses of wine, 2 Cans of beer per week     Comment: COUPLE OF DRINKS ON WEEKENDS    Review of Systems  Skin: Positive for wound.  Neurological: Negative  for numbness.  All other systems reviewed and are negative.    Allergies  Review of patient's allergies indicates no known allergies.  Home Medications   Current Outpatient Rx  Name  Route  Sig  Dispense  Refill  . Folate-B12-Intrinsic Factor 800-500-20 MCG-MCG-MG TABS   Oral   Take 400 mg by mouth daily.          Marland Kitchen HORSE CHESTNUT PO   Oral   Take 470 mg by mouth daily.          Marland Kitchen ibuprofen (ADVIL,MOTRIN) 200 MG tablet   Oral   Take 400-800 mg by mouth every 6 (six) hours as needed. For pain         . Multiple Vitamin (MULTIVITAMIN) tablet   Oral   Take 1 tablet by mouth daily.          Marland Kitchen RHODIOLA ROSEA PO   Oral   Take 500 mg by mouth daily.          . vitamin B-12 (CYANOCOBALAMIN) 50 MCG tablet   Oral   Take 30 mcg by mouth daily.          Marland Kitchen VITAMIN C, CALCIUM ASCORBATE, PO   Oral   Take 15 mg by mouth daily.  BP 137/98  Pulse 93  Temp(Src) 98.2 F (36.8 C) (Oral)  Resp 18  Wt 230 lb (104.327 kg)  BMI 33.49 kg/m2  SpO2 98%  Physical Exam  Nursing note and vitals reviewed. Constitutional: He is oriented to person, place, and time. He appears well-developed and well-nourished. No distress.  HENT:  Head: Normocephalic and atraumatic.  Right Ear: External ear normal.  Left Ear: External ear normal.  Nose: Nose normal.  Eyes: Conjunctivae are normal.  Neck: Normal range of motion. No tracheal deviation present.  Cardiovascular: Normal rate, regular rhythm and normal heart sounds.   Pulmonary/Chest: Effort normal and breath sounds normal. No stridor.  Abdominal: Soft. He exhibits no distension. There is no tenderness.  Musculoskeletal: Normal range of motion.  Capillary refill less than 3 seconds in right fingers.  Neurological: He is alert and oriented to person, place, and time.  Flexion and extension tested against resistance. 5/5 strength. Neurovascularly intact. Sensation intact. Compartments soft.   Skin: Skin is warm and  dry. He is not diaphoretic.  1 cm superficial puncture wound on palmar aspect of right fourth finger.   Psychiatric: He has a normal mood and affect. His behavior is normal.    ED Course  Procedures (including critical care time)  DIAGNOSTIC STUDIES: Oxygen Saturation is 98% on RA, normal by my interpretation.    COORDINATION OF CARE: 6:38 PM-Discussed treatment plan which includes cleaning wound, antibiotic and updating tetanus with pt at bedside and pt agreed to plan.   Labs Review Labs Reviewed - No data to display Imaging Review Dg Hand Complete Right  09/12/2013   CLINICAL DATA:  Puncture wound to base of 4th digit  EXAM: RIGHT HAND - COMPLETE 3+ VIEW  COMPARISON:  None.  FINDINGS: No evidence of fracture or dislocation.  The joint spaces are preserved.  The visualized soft tissues are unremarkable.  No radiopaque foreign body is seen.  IMPRESSION: No fracture, dislocation, or radiopaque foreign body is seen.   Electronically Signed   By: Charline Bills M.D.   On: 09/12/2013 18:14    MDM   1. Puncture wound of finger, initial encounter    Patient presents with a very superficial puncture wound of his fourth finger. X-ray shows no fracture, dislocation, foreign body. TDAP updated. Wound was irrigated with soap, water, normal saline. Strength tested against resistance and was normal. Cap refill less than 3 seconds. Sensation intact. Patient is nondiabetic. Wound is clean and I do not believe this patient needs antibiotics at this time. Strict return instructions given. Signs of infection discussed. Vital signs stable for discharge. Patient / Family / Caregiver informed of clinical course, understand medical decision-making process, and agree with plan.     I personally performed the services described in this documentation, which was scribed in my presence. The recorded information has been reviewed and is accurate.    Mora Bellman, PA-C 09/12/13 1941

## 2013-09-12 NOTE — ED Notes (Signed)
Puncture wound to base of (4TH) finger r/hand. Bleeding controlled

## 2013-09-13 NOTE — ED Provider Notes (Signed)
Medical screening examination/treatment/procedure(s) were performed by non-physician practitioner and as supervising physician I was immediately available for consultation/collaboration.  Bernadetta Roell F Kaden Daughdrill, MD 09/13/13 0858 

## 2014-02-21 ENCOUNTER — Ambulatory Visit
Admission: RE | Admit: 2014-02-21 | Discharge: 2014-02-21 | Disposition: A | Discharge status: Home / Self Care | Payer: 59 | Source: Ambulatory Visit | Attending: Family Medicine | Admitting: Family Medicine

## 2014-02-21 ENCOUNTER — Other Ambulatory Visit: Payer: Self-pay | Admitting: Family Medicine

## 2014-02-21 DIAGNOSIS — R509 Fever, unspecified: Secondary | ICD-10-CM

## 2014-02-21 DIAGNOSIS — R109 Unspecified abdominal pain: Secondary | ICD-10-CM

## 2014-02-21 DIAGNOSIS — R6883 Chills (without fever): Secondary | ICD-10-CM

## 2014-02-21 MED ORDER — IOHEXOL 300 MG/ML  SOLN
125.0000 mL | Freq: Once | INTRAMUSCULAR | Status: AC | PRN
  Administered 2014-02-21: 125 mL via INTRAVENOUS

## 2015-09-06 IMAGING — CT CT ABD-PELV W/ CM
2 of 5 series · 17 of 46 positions shown, 19 images · IV contrast (30CC OMNI 300 & [ID] OMNI 300)
Comparison: none

[Series 4: thin recons · axial · 0.82mm/px · z∈[-482,-8]mm · 14 of 516 slices shown, 16 images]
[im 21/516  soft-tissue]
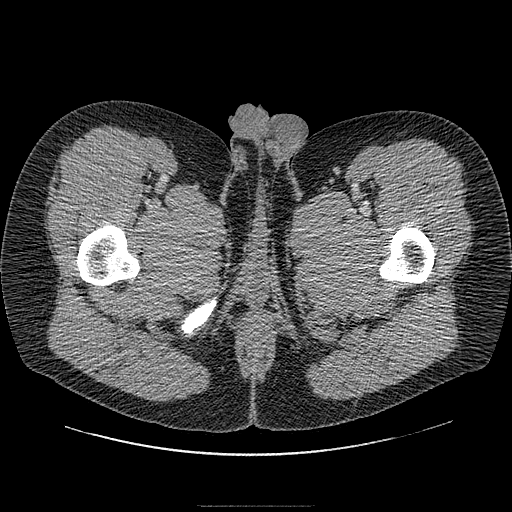
[im 21/516  bone]
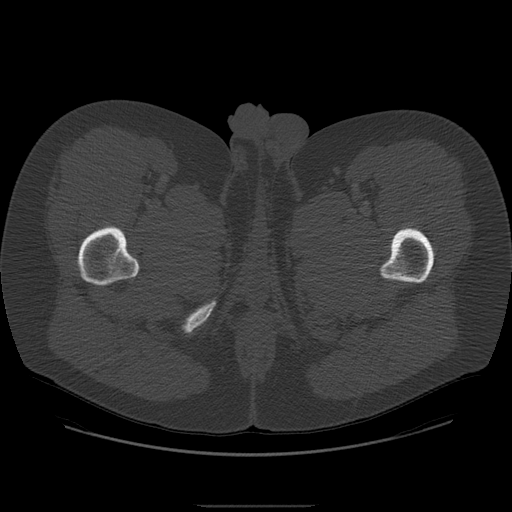
[im 62/516  soft-tissue]
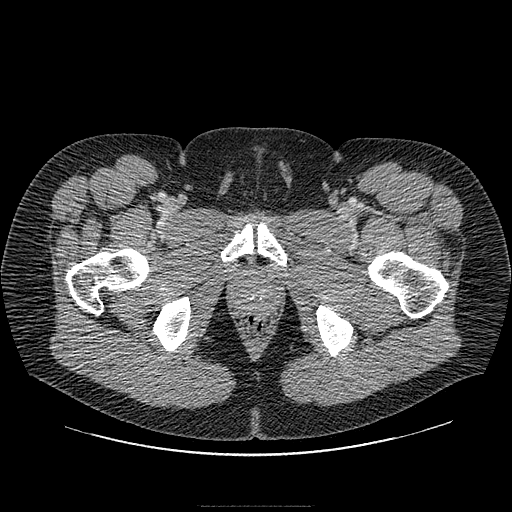
[im 104/516  soft-tissue]
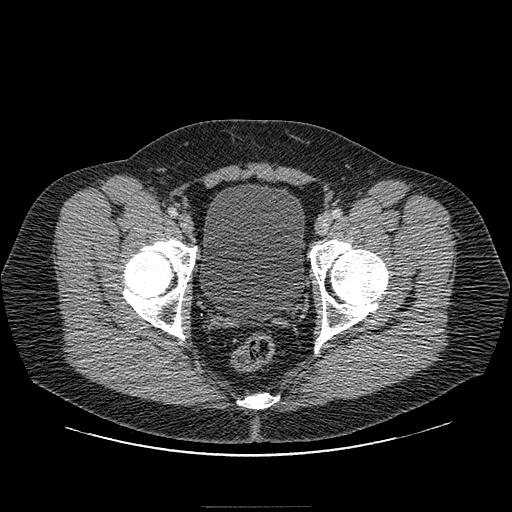
[im 145/516  soft-tissue]
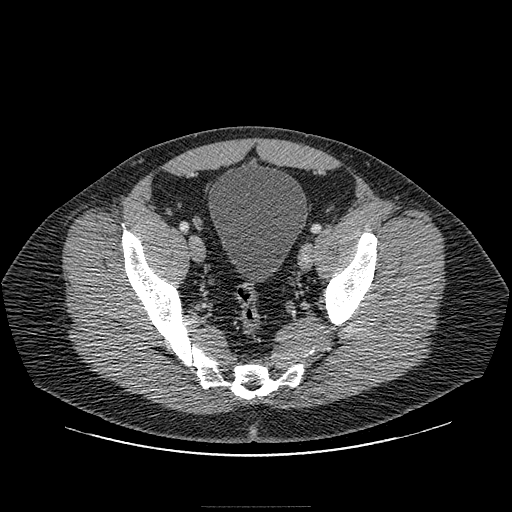
[im 165/516  soft-tissue]
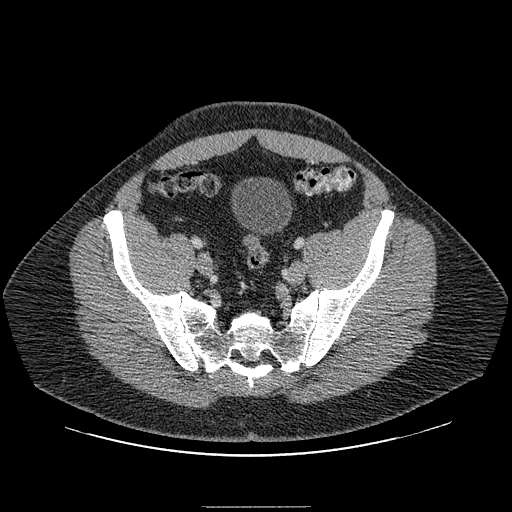
[im 207/516  soft-tissue]
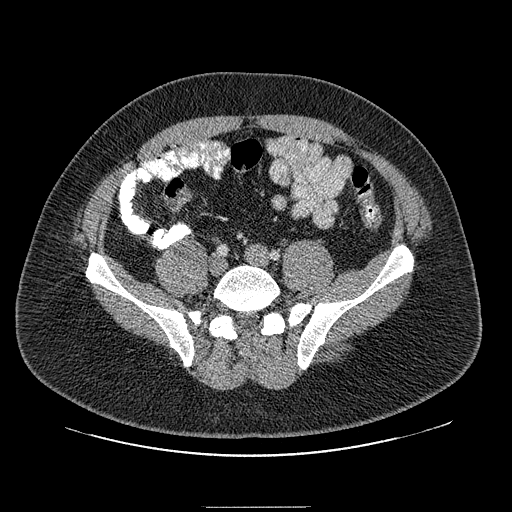
[im 248/516  soft-tissue]
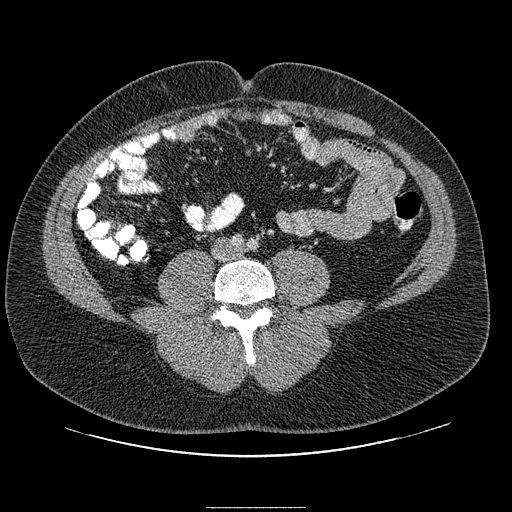
[im 268/516  soft-tissue]
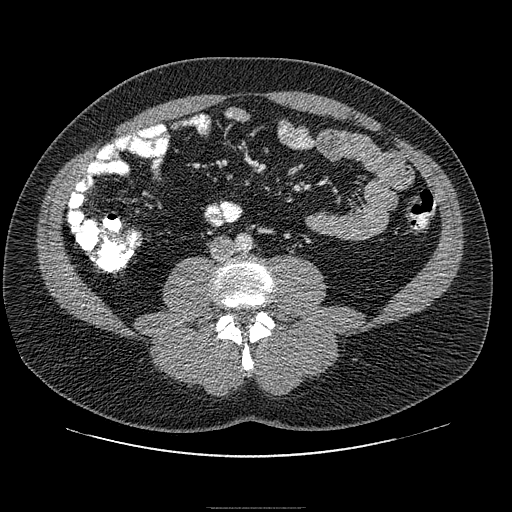
[im 310/516  soft-tissue]
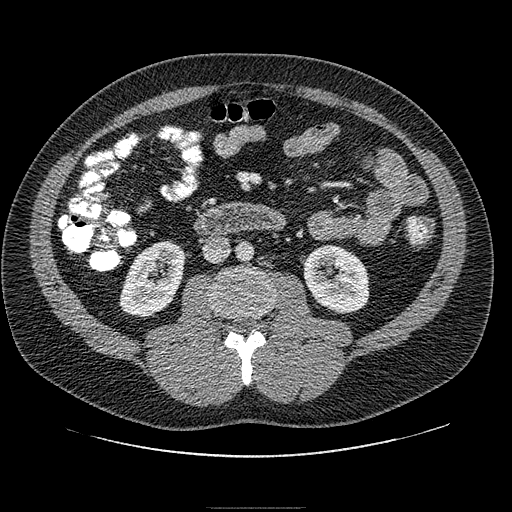
[im 310/516  bone]
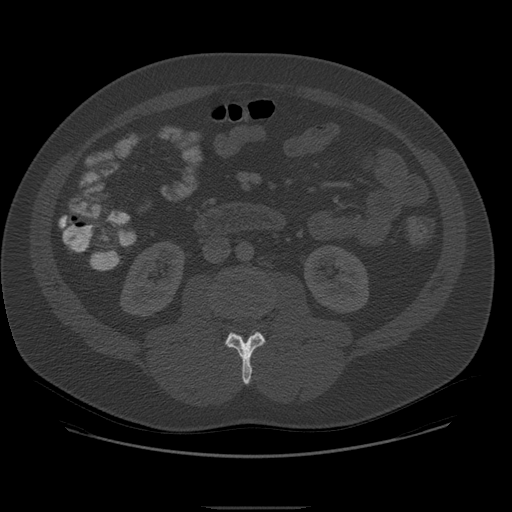
[im 351/516  soft-tissue]
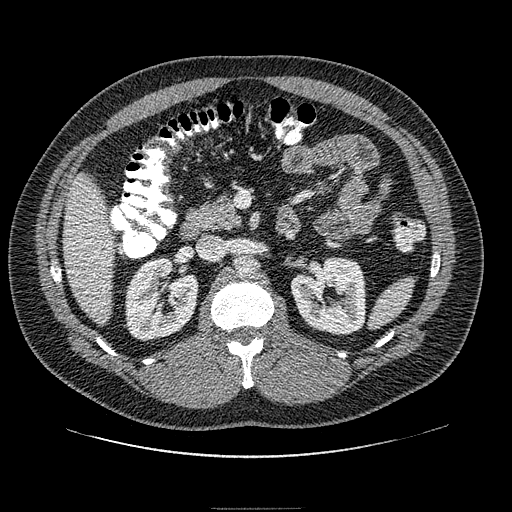
[im 392/516  soft-tissue]
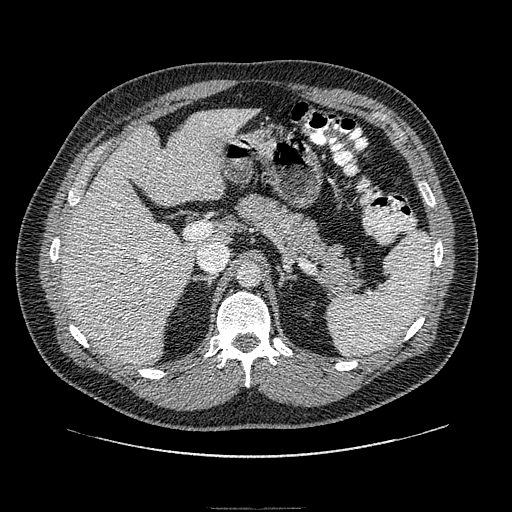
[im 413/516  soft-tissue]
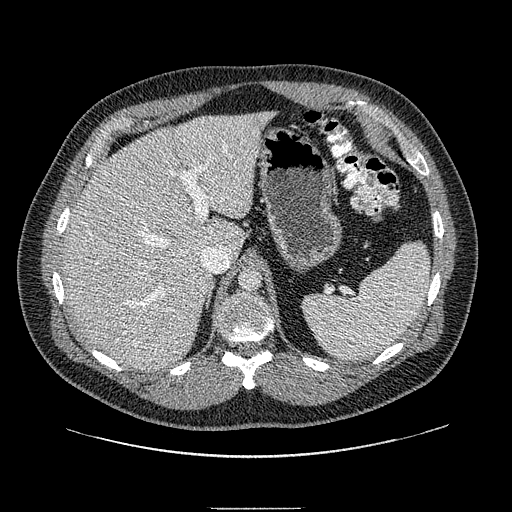
[im 454/516  soft-tissue]
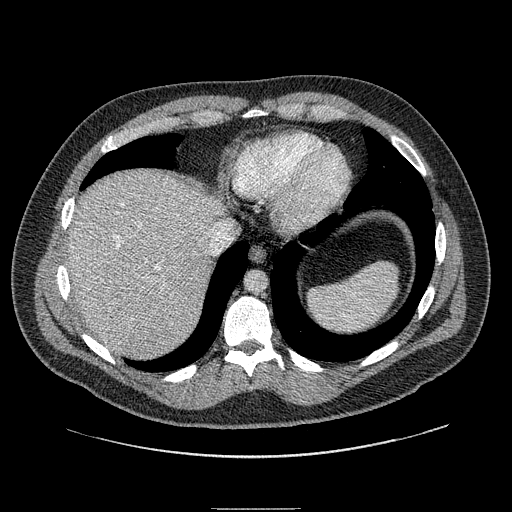
[im 495/516  soft-tissue]
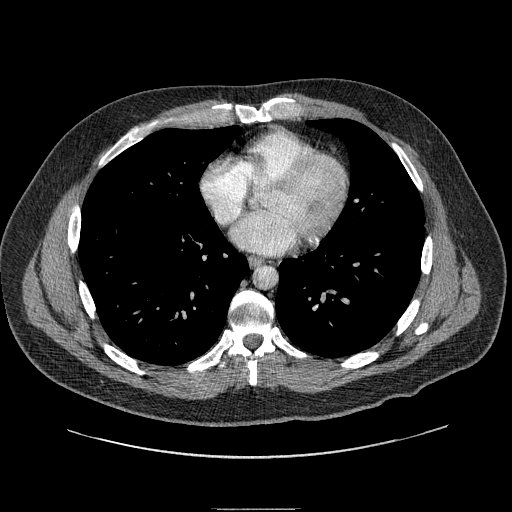

[Series 400: cor · coronal · 1.03mm/px · 3 of 150 slices shown]
[im 50/150  soft-tissue]
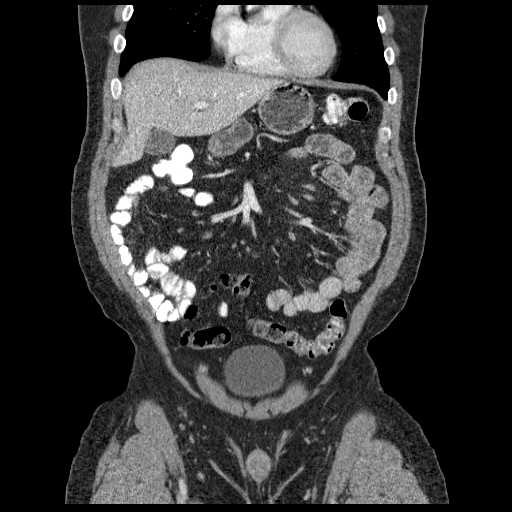
[im 67/150  soft-tissue]
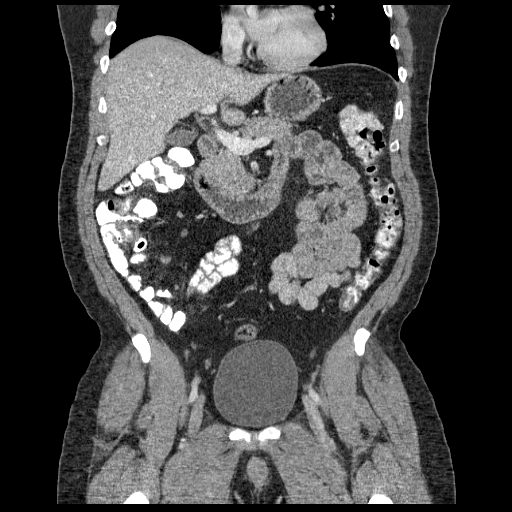
[im 83/150  soft-tissue]
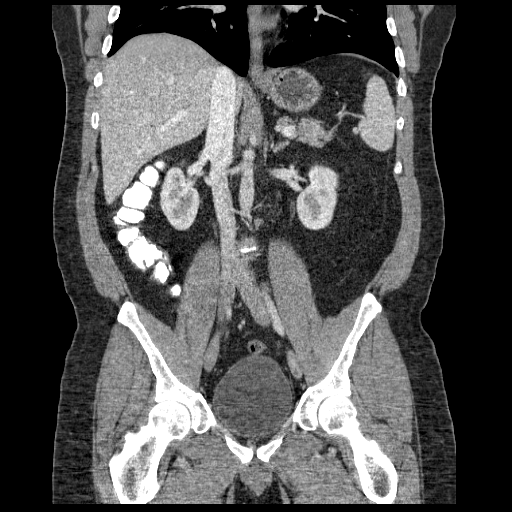

[17 of 46 positions shown; findings below may reference images not displayed]

CLINICAL DATA
Diffuse abdomen and pelvic pain for 4 days, fever and chills

EXAM
CT ABDOMEN AND PELVIS WITH CONTRAST

TECHNIQUE
Multidetector CT imaging of the abdomen and pelvis was performed
using the standard protocol following bolus administration of
intravenous contrast.

CONTRAST
125mL OMNIPAQUE IOHEXOL 300 MG/ML  SOLN

COMPARISON
None.

FINDINGS
The lung bases are clear. The liver enhances with no focal
abnormality and no ductal dilatation is seen. No calcified
gallstones are noted. The pancreas is normal in size and the
pancreatic duct is not dilated. The peripancreatic fat planes are
well preserved. The adrenal glands and spleen are unremarkable. The
stomach is moderately fluid distended with no abnormality noted. The
kidneys enhance with no calculus or mass and no hydronephrosis is
seen. The abdominal aorta is normal in caliber. Only small
mesenteric and retroperitoneal lymph nodes are present.

There are minimally prominent lymph nodes in the right lower
quadrant of doubtful significance significance, possibly due to
gastroenteritis. The terminal ileum and the appendix are well
visualized both of which appear normal. The urinary bladder is
moderately distended with no abnormality noted, and the prostate is
normal in size. No fluid is seen within the pelvis. No abnormality
of the colon is seen. The small bowel is not dilated. The lumbar
vertebrae are in normal alignment with no abnormality noted.

IMPRESSION
1. No explanation for the patient's abdominal and pelvic pain is
seen. Only a few small minimally prominent lymph nodes are present
in the right lower quadrant of doubtful significance.
2. The appendix and terminal ileum appear normal.

SIGNATURE
